# Patient Record
Sex: Male | Born: 2015
Health system: Southern US, Community
[De-identification: ages and names within clinical notes are randomized; demographics above are authoritative.]

## PROBLEM LIST (undated history)

## (undated) HISTORY — PX: CIRCUMCISION: SUR203

---
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
## 8387-03-10 DEATH — deceased
